# Patient Record
Sex: Female | Born: 2005 | Race: Black or African American | Hispanic: No | Marital: Single | Smoking: Never smoker
Health system: Southern US, Community
[De-identification: ages and names within clinical notes are randomized; demographics above are authoritative.]

## PROBLEM LIST (undated history)

## (undated) DIAGNOSIS — L729 Follicular cyst of the skin and subcutaneous tissue, unspecified: Secondary | ICD-10-CM

## (undated) HISTORY — PX: CYST EXCISION: SHX5701

---
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
## 2023-09-21 ENCOUNTER — Encounter: Payer: Self-pay | Admitting: *Deleted
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
## 2023-09-25 ENCOUNTER — Encounter: Payer: Managed Care, Other (non HMO) | Admitting: Family Medicine

## 2023-09-25 ENCOUNTER — Telehealth: Payer: Self-pay | Admitting: *Deleted

## 2023-09-25 DIAGNOSIS — Z0000 Encounter for general adult medical examination without abnormal findings: Secondary | ICD-10-CM

## 2023-09-25 DIAGNOSIS — E559 Vitamin D deficiency, unspecified: Secondary | ICD-10-CM

## 2023-09-25 NOTE — Telephone Encounter (Signed)
This patient no showed for their appointment today.Which of the following is necessary for this patient.   A) No follow-up necessary   B) Follow-up urgent. Locate Patient Immediately.   C) Follow-up necessary. Contact patient and Schedule visit in ____ Days.   D) Follow-up Advised. Contact patient and Schedule visit in ____ Days.   E) Please Send no show letter to patient. Charge no show fee if no show was a CPE.   

## 2023-09-28 ENCOUNTER — Encounter: Payer: Self-pay | Admitting: Family Medicine
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
## 2024-01-17 ENCOUNTER — Encounter: Payer: Self-pay | Admitting: Family Medicine

## 2024-01-17 ENCOUNTER — Ambulatory Visit: Payer: Self-pay | Admitting: Family Medicine

## 2024-01-17 VITALS — BP 92/60 | HR 84 | Ht 64.0 in | Wt 133.4 lb

## 2024-01-17 DIAGNOSIS — Z113 Encounter for screening for infections with a predominantly sexual mode of transmission: Secondary | ICD-10-CM

## 2024-01-17 DIAGNOSIS — Z0000 Encounter for general adult medical examination without abnormal findings: Secondary | ICD-10-CM

## 2024-01-17 DIAGNOSIS — Z23 Encounter for immunization: Secondary | ICD-10-CM | POA: Diagnosis not present

## 2024-01-17 DIAGNOSIS — E559 Vitamin D deficiency, unspecified: Secondary | ICD-10-CM

## 2024-01-17 DIAGNOSIS — E611 Iron deficiency: Secondary | ICD-10-CM

## 2024-01-17 NOTE — Patient Instructions (Addendum)
 Based on your labs from last year, your iron stores were low, but you weren't anemic. The same recommendations hold true now as last year--try and either take a iron supplement (ferrous sulfate 325mg /65 mg elemental) once daily during the week of your menstrual cycle, or a multivitamin that has iron in it, and take that every day (prenatal vitamins usually have some iron).  If you feel more fatigued, dizzy, chewing/craving more ice or other weird foods, then we can recheck labs.   Look back to last year's after visit summary for information on iron-rich foods.  Flu shots are encouraged yearly. You can get this from the pharmacy if you change your mind.

## 2025-02-12 ENCOUNTER — Encounter: Admitting: Family Medicine
